# Patient Record
Sex: Female | Born: 1980 | Race: White | Hispanic: No | Marital: Single | Smoking: Never smoker
Health system: Southern US, Community
[De-identification: ages and names within clinical notes are randomized; demographics above are authoritative.]

## PROBLEM LIST (undated history)

## (undated) HISTORY — PX: TUBAL LIGATION: SHX77

## (undated) HISTORY — PX: CHOLECYSTECTOMY: SHX55

---

## 2004-03-20 ENCOUNTER — Emergency Department: Payer: Self-pay | Admitting: Emergency Medicine

## 2004-03-23 ENCOUNTER — Emergency Department: Payer: Self-pay | Admitting: Emergency Medicine

## 2004-11-05 ENCOUNTER — Inpatient Hospital Stay: Payer: Self-pay

## 2005-08-04 ENCOUNTER — Emergency Department: Payer: Self-pay | Admitting: Internal Medicine

## 2005-08-06 ENCOUNTER — Ambulatory Visit: Payer: Self-pay | Admitting: Internal Medicine

## 2005-08-06 ENCOUNTER — Ambulatory Visit: Payer: Self-pay | Admitting: General Surgery

## 2005-08-08 ENCOUNTER — Inpatient Hospital Stay: Payer: Self-pay | Admitting: General Surgery

## 2007-04-07 ENCOUNTER — Emergency Department: Payer: Self-pay | Admitting: Emergency Medicine

## 2007-08-12 IMAGING — CR DG ABDOMEN 2V
1 series · 3 of 3 positions shown · non-contrast
Comparison: none

REASON FOR EXAM: Abd Pain; post lap GB
COMMENTS:

[Series 1: view not recorded · 0.17mm/px · 3 of 3 slices shown]
[im 1/3]
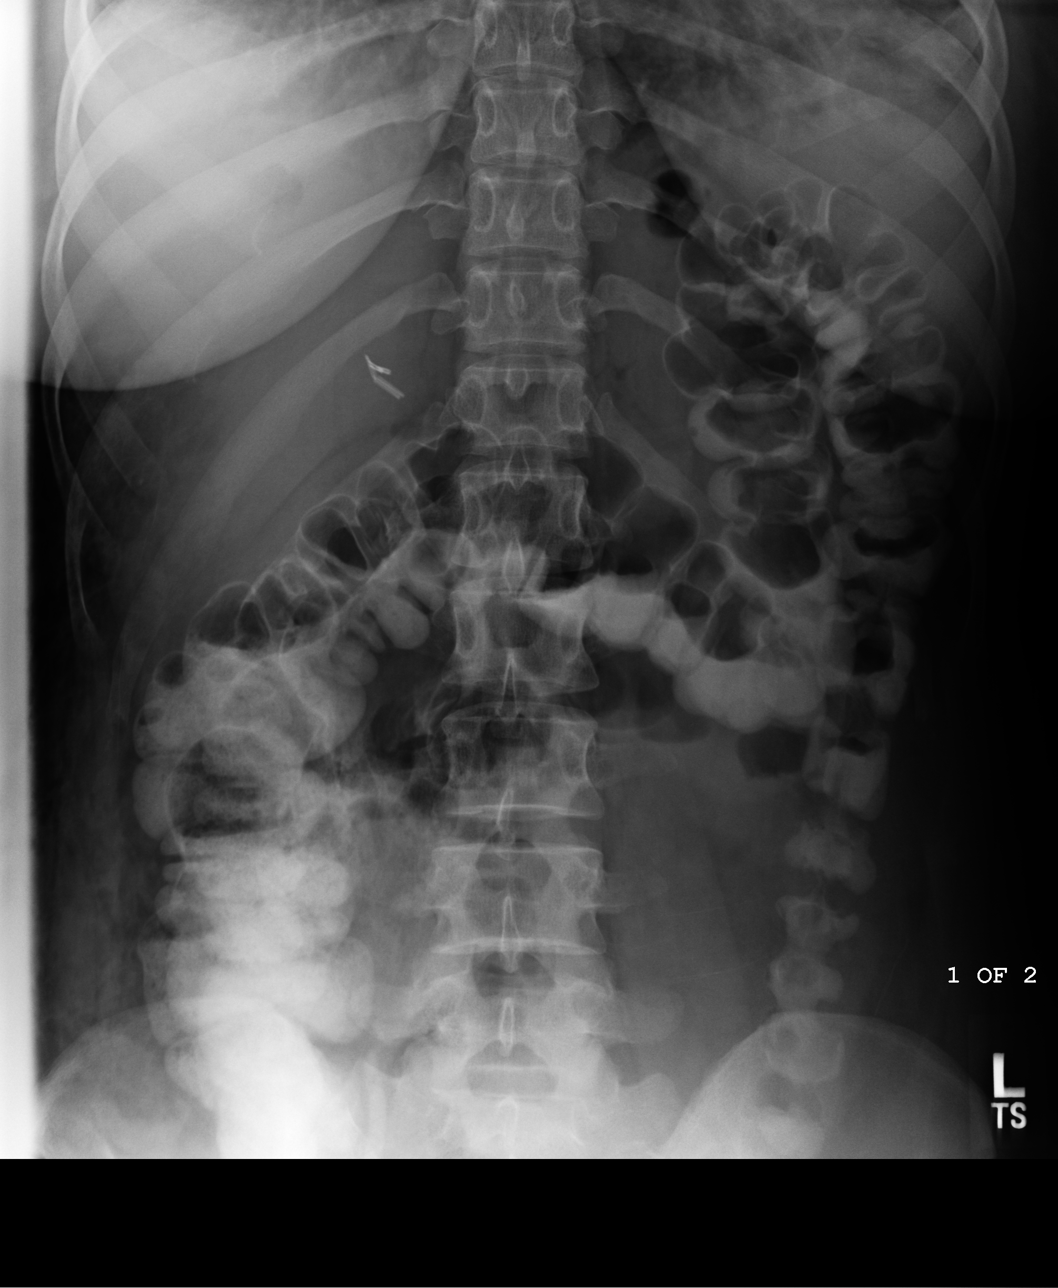
[im 2/3]
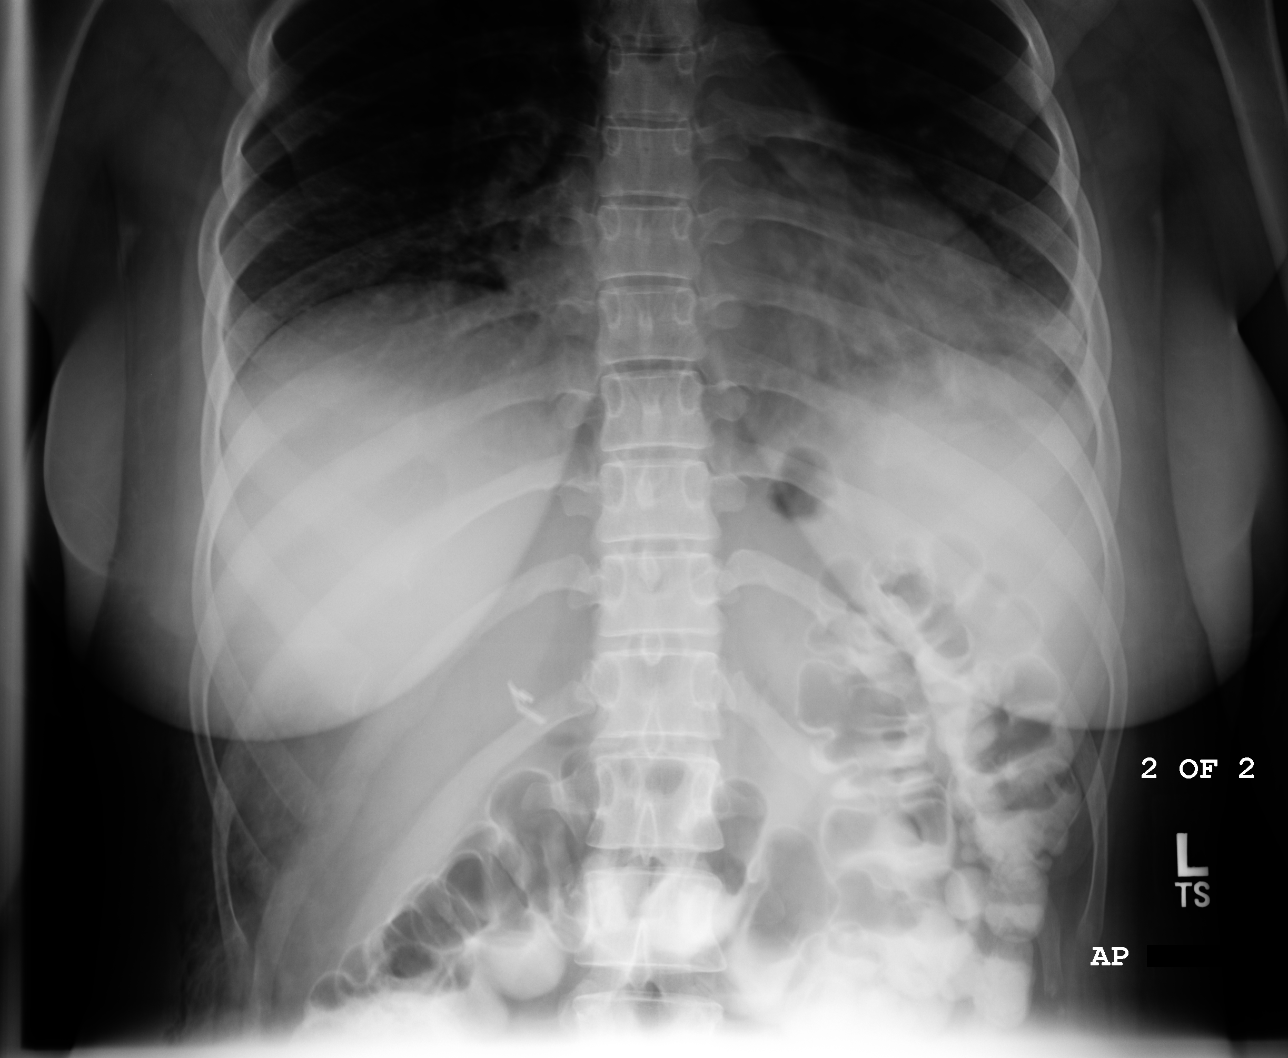
[im 3/3]
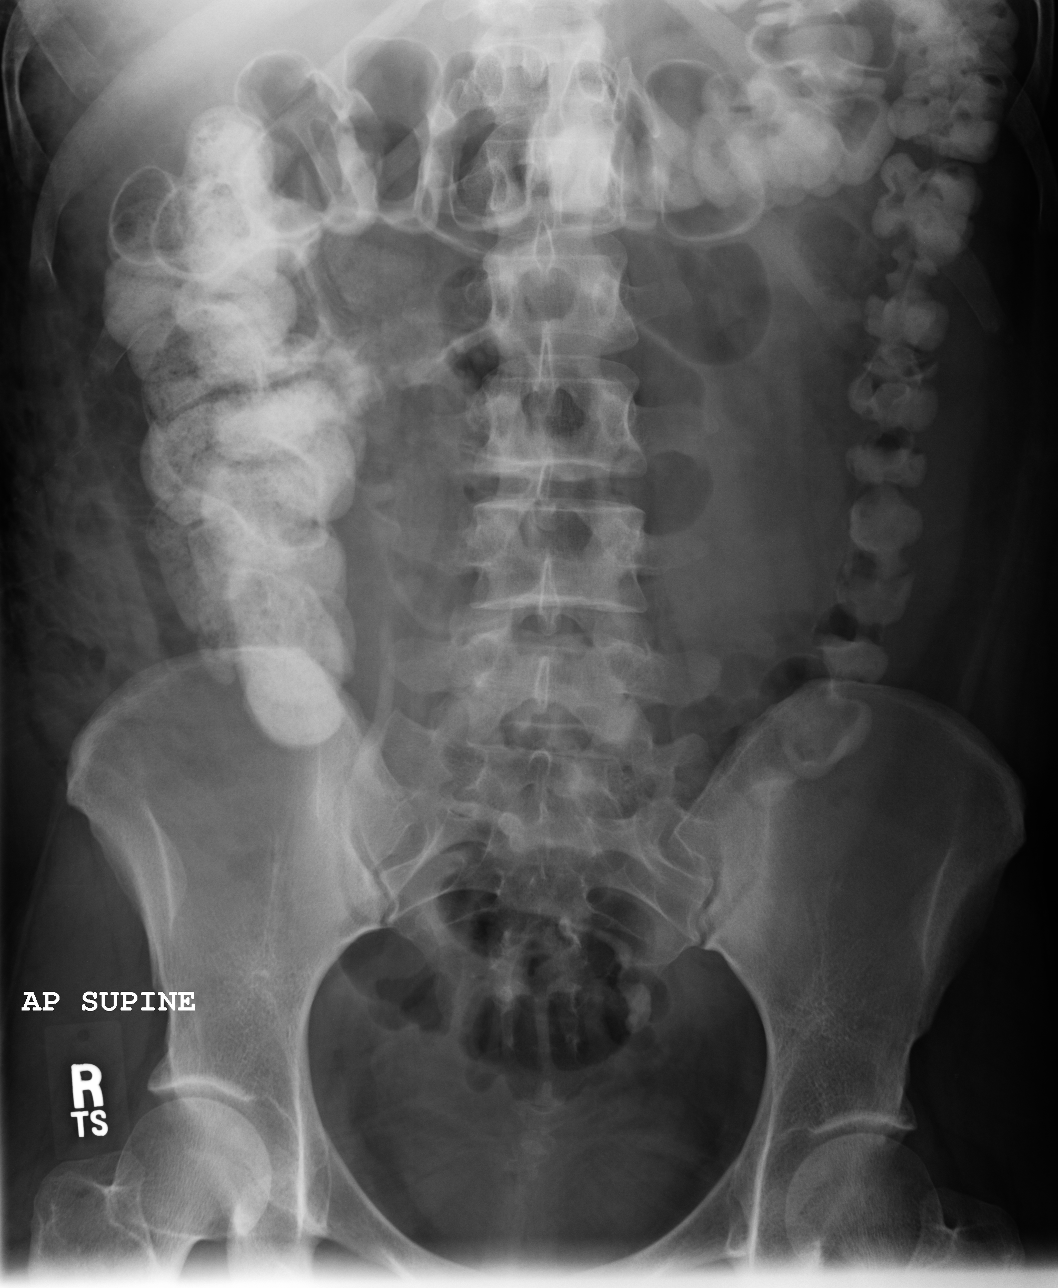

[3 of 3 positions shown; findings below may reference images not displayed]

PROCEDURE:     DXR - DXR ABDOMEN 2 V FLAT AND ERECT  - August 09, 2005  [DATE]

RESULT:     Flat and erect views of the abdomen show contrast material in
the colon compatible with residual contrast from prior CT examination.  No
evidence for bowel obstruction is seen. No abnormal intraabdominal
calcifications are identified, but of course the bowel contrast could
obscure calcifications.  Postoperative metallic clips are seen in the region
of the gallbladder bed.

The erect view shows no subdiaphragmatic free air.  There is noted increased
density in the LEFT lung base compatible with pneumonia or atelectasis.
IMPRESSION: 1)Views of the abdomen show no specific abnormalities.

2)In the erect view the LEFT lung base is visualized where there is noted
increased density compatible with pneumonia or atelectasis.

## 2010-03-13 ENCOUNTER — Emergency Department: Payer: Self-pay | Admitting: Emergency Medicine

## 2011-07-15 ENCOUNTER — Emergency Department: Payer: Self-pay | Admitting: Unknown Physician Specialty

## 2011-07-15 LAB — COMPREHENSIVE METABOLIC PANEL
Anion Gap: 8 (ref 7–16)
BUN: 10 mg/dL (ref 7–18)
Bilirubin,Total: 0.3 mg/dL (ref 0.2–1.0)
Calcium, Total: 8.9 mg/dL (ref 8.5–10.1)
Chloride: 107 mmol/L (ref 98–107)
Co2: 27 mmol/L (ref 21–32)
Creatinine: 0.61 mg/dL (ref 0.60–1.30)
EGFR (African American): >60
Potassium: 4.4 mmol/L (ref 3.5–5.1)
SGOT(AST): 15 U/L (ref 15–37)
Sodium: 142 mmol/L (ref 136–145)

## 2011-07-15 LAB — CBC
HCT: 42.2 % (ref 35.0–47.0)
MCV: 94 fL (ref 80–100)
Platelet: 185 10*3/uL (ref 150–440)
RBC: 4.48 10*6/uL (ref 3.80–5.20)
RDW: 13.7 % (ref 11.5–14.5)

## 2012-05-31 ENCOUNTER — Emergency Department: Payer: Self-pay | Admitting: Internal Medicine

## 2013-02-01 ENCOUNTER — Emergency Department: Payer: Self-pay | Admitting: Emergency Medicine

## 2013-02-01 LAB — CBC WITH DIFFERENTIAL/PLATELET
BASOS ABS: 0.1 10*3/uL (ref 0.0–0.1)
Basophil %: 0.6 %
Eosinophil #: 0.5 10*3/uL (ref 0.0–0.7)
Eosinophil %: 4.3 %
HCT: 39.2 % (ref 35.0–47.0)
HGB: 13.3 g/dL (ref 12.0–16.0)
LYMPHS ABS: 2.6 10*3/uL (ref 1.0–3.6)
LYMPHS PCT: 20.5 %
MCH: 31.2 pg (ref 26.0–34.0)
MCHC: 33.8 g/dL (ref 32.0–36.0)
MCV: 92 fL (ref 80–100)
MONO ABS: 0.7 x10 3/mm (ref 0.2–0.9)
Monocyte %: 5.3 %
NEUTROS ABS: 8.8 10*3/uL — AB (ref 1.4–6.5)
Neutrophil %: 69.3 %
PLATELETS: 191 10*3/uL (ref 150–440)
RBC: 4.25 10*6/uL (ref 3.80–5.20)
RDW: 13.6 % (ref 11.5–14.5)
WBC: 12.7 10*3/uL — ABNORMAL HIGH (ref 3.6–11.0)

## 2013-02-01 LAB — BASIC METABOLIC PANEL
Anion Gap: 4 — ABNORMAL LOW (ref 7–16)
BUN: 9 mg/dL (ref 7–18)
CALCIUM: 8.9 mg/dL (ref 8.5–10.1)
CO2: 25 mmol/L (ref 21–32)
Chloride: 106 mmol/L (ref 98–107)
Creatinine: 0.67 mg/dL (ref 0.60–1.30)
EGFR (African American): >60
EGFR (Non-African Amer.): >60
Glucose: 98 mg/dL (ref 65–99)
Osmolality: 269 (ref 275–301)
POTASSIUM: 3.9 mmol/L (ref 3.5–5.1)
Sodium: 135 mmol/L — ABNORMAL LOW (ref 136–145)

## 2013-05-12 ENCOUNTER — Emergency Department: Payer: Self-pay | Admitting: Emergency Medicine

## 2013-07-17 IMAGING — CT CT HEAD WITHOUT CONTRAST
2 series · 16 of 30 positions shown, 20 images · non-contrast
Comparison: none

REASON FOR EXAM: ha
COMMENTS:

[Series 2: without · axial · non-contrast · 0.46mm/px · z∈[-20,+110]mm · 13 of 32 slices shown, 17 images]
[im 3/32  brain]
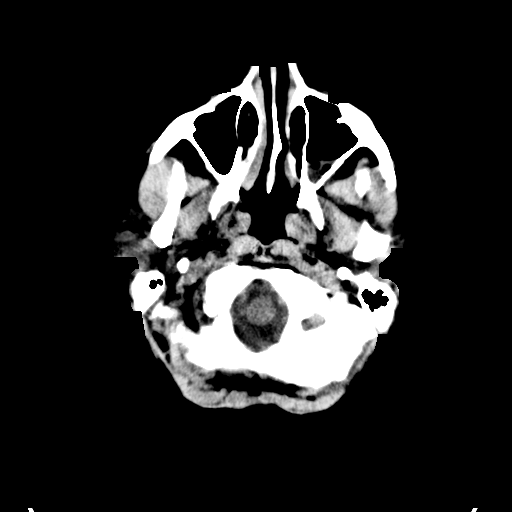
[im 3/32  bone]
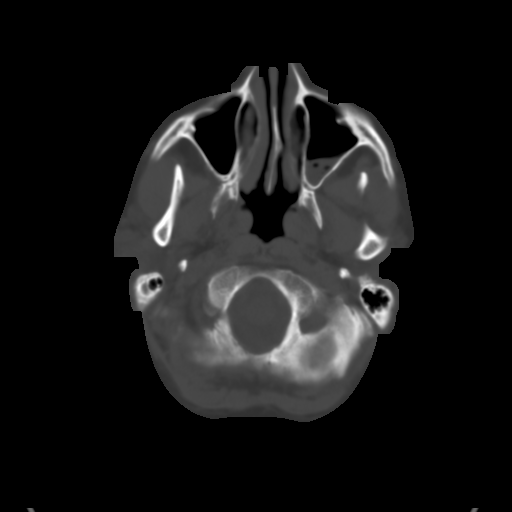
[im 5/32  brain]
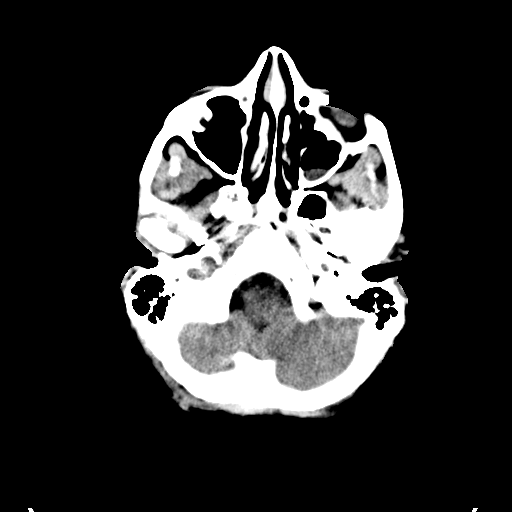
[im 7/32  brain]
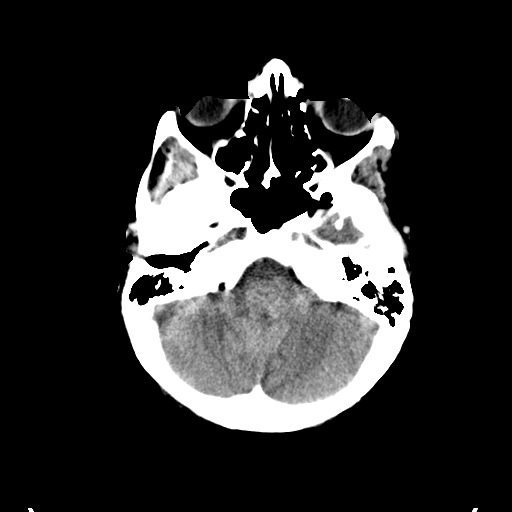
[im 9/32  brain]
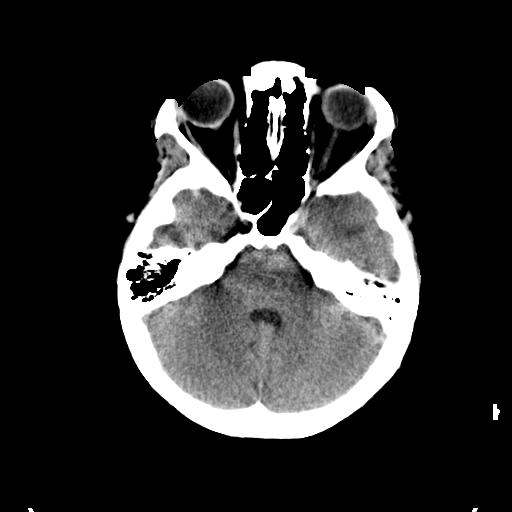
[im 12/32  brain]
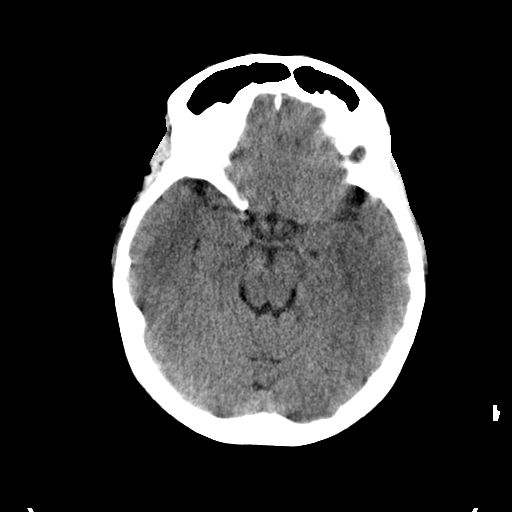
[im 12/32  bone]
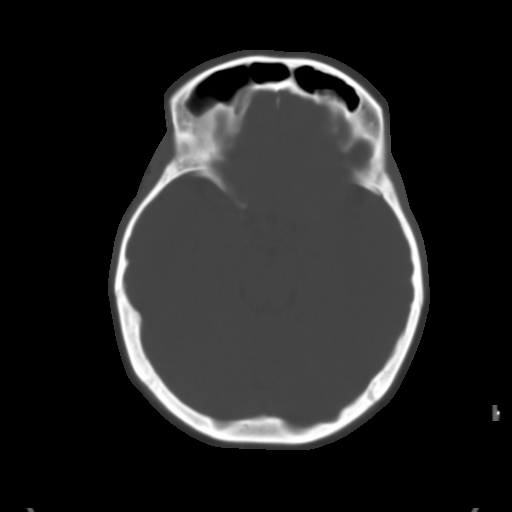
[im 14/32  brain]
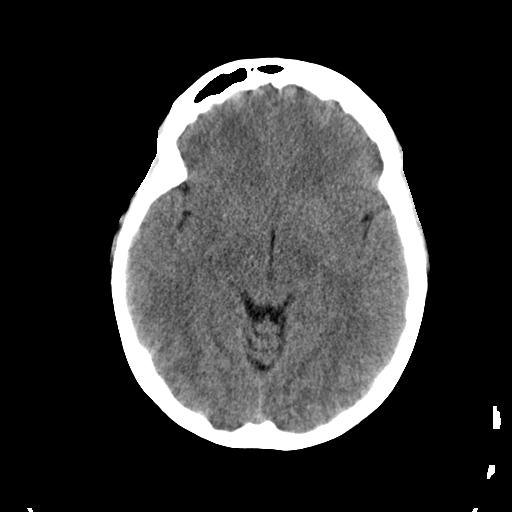
[im 16/32  brain]
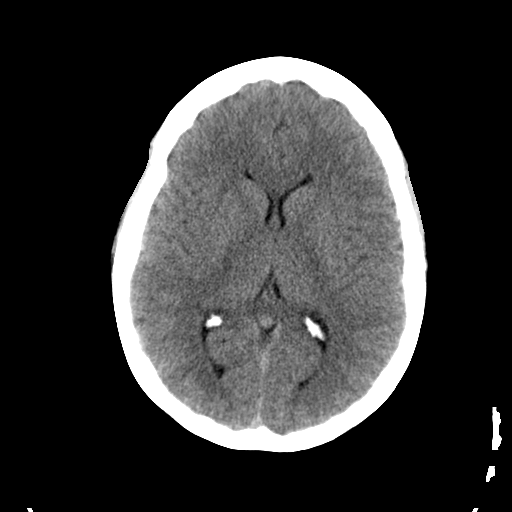
[im 18/32  brain]
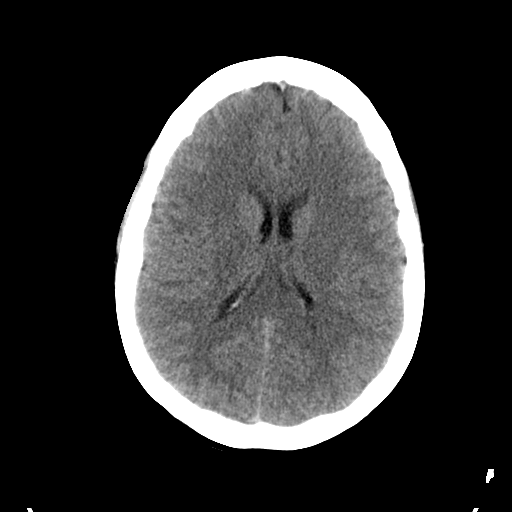
[im 20/32  brain]
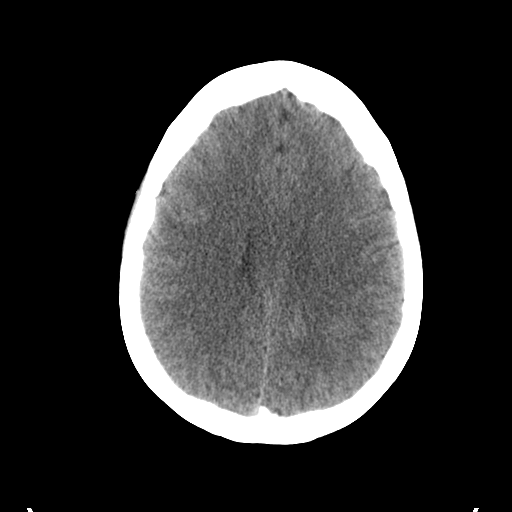
[im 20/32  bone]
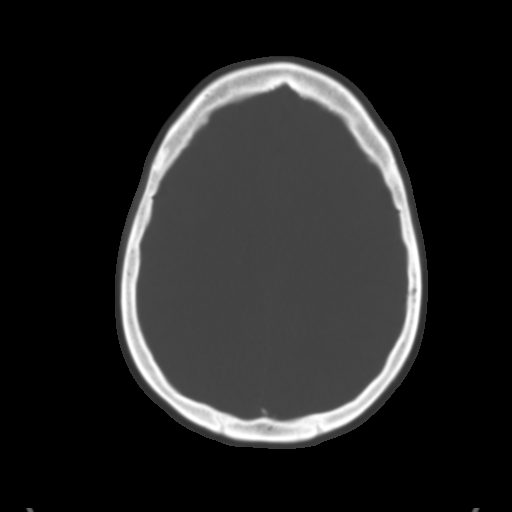
[im 23/32  brain]
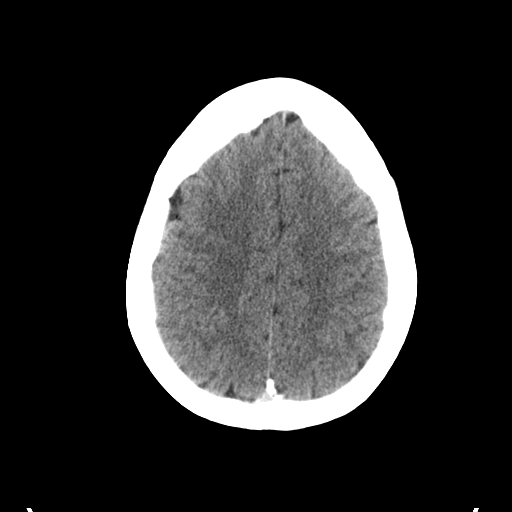
[im 25/32  brain]
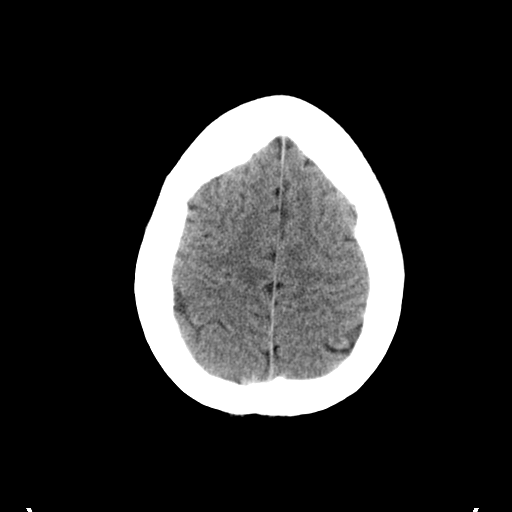
[im 27/32  brain]
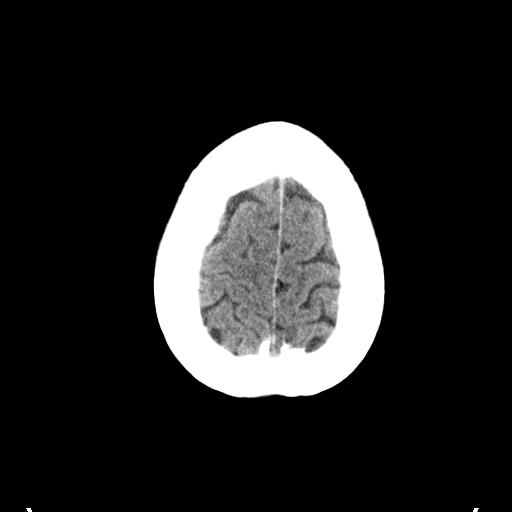
[im 29/32  brain]
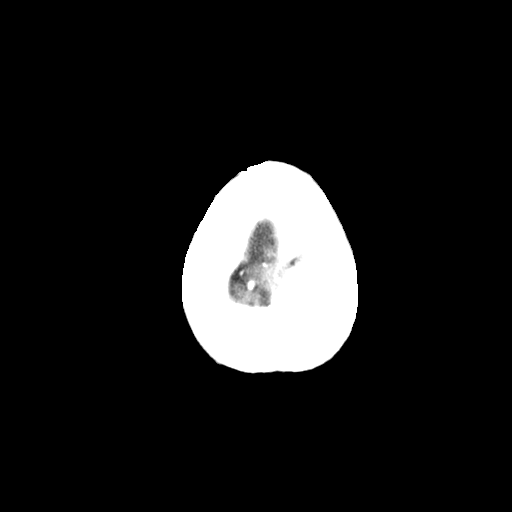
[im 29/32  bone]
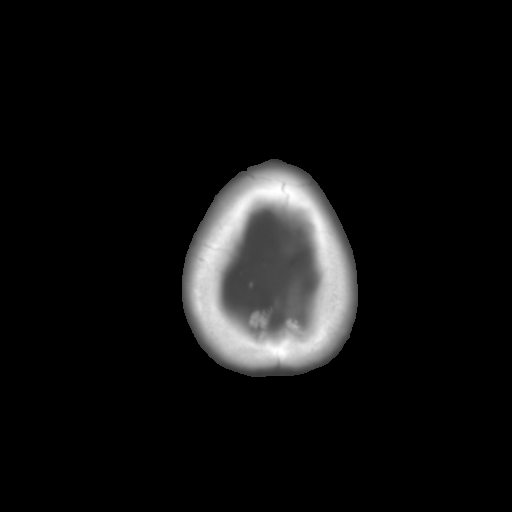

[Series 3: bone · axial · 0.46mm/px · z∈[-20,+24]mm · 3 of 32 slices shown]
[im 3/32  bone]
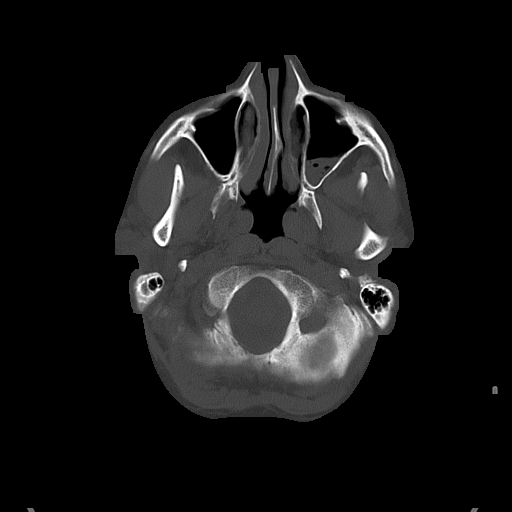
[im 7/32  bone]
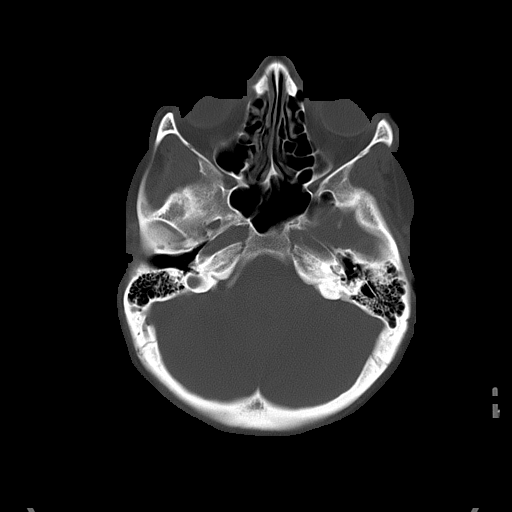
[im 12/32  bone]
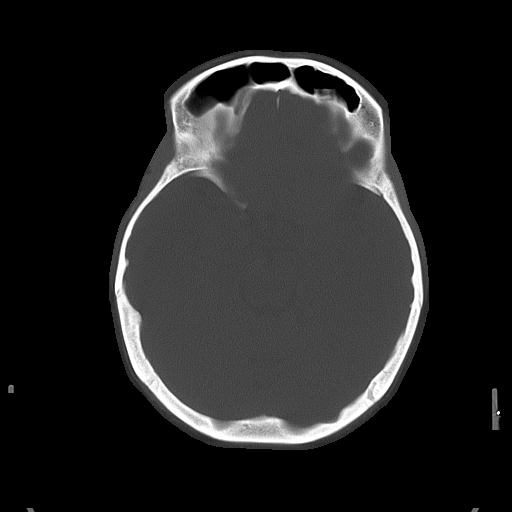

[16 of 30 positions shown; findings below may reference images not displayed]

PROCEDURE:     CT  - CT HEAD WITHOUT CONTRAST  - July 15, 2011  [DATE]

RESULT:     Axial noncontrast CT scanning was performed through the brain
with reconstructions at 5 mm intervals and slice thicknesses.

The ventricles are normal in size and position. There is no intracranial
hemorrhage nor intracranial mass effect. The cerebellum and brainstem are
normal in density. There is no evidence of an evolving ischemic event.

There is an air-fluid level in the left maxillary sinus. I see no evidence
of an acute skull fracture.
IMPRESSION: 1. I see no acute abnormality of the brain.
2. An air-fluid level is present in the left maxillary sinus consistent with
acute sinusitis.

A preliminary report was sent to the [HOSPITAL] the conclusion
of the study.

## 2015-02-05 IMAGING — CR RIGHT ANKLE - COMPLETE 3+ VIEW
1 series · 3 of 3 positions shown · non-contrast
Comparison: None.

CLINICAL DATA: Redness and swelling for 1 the. Evaluate for
subcutaneous emphysema.

EXAM:
RIGHT ANKLE - COMPLETE 3+ VIEW

[Series 1: ap · 0.17mm/px · 3 of 3 slices shown]
[im 1/3]
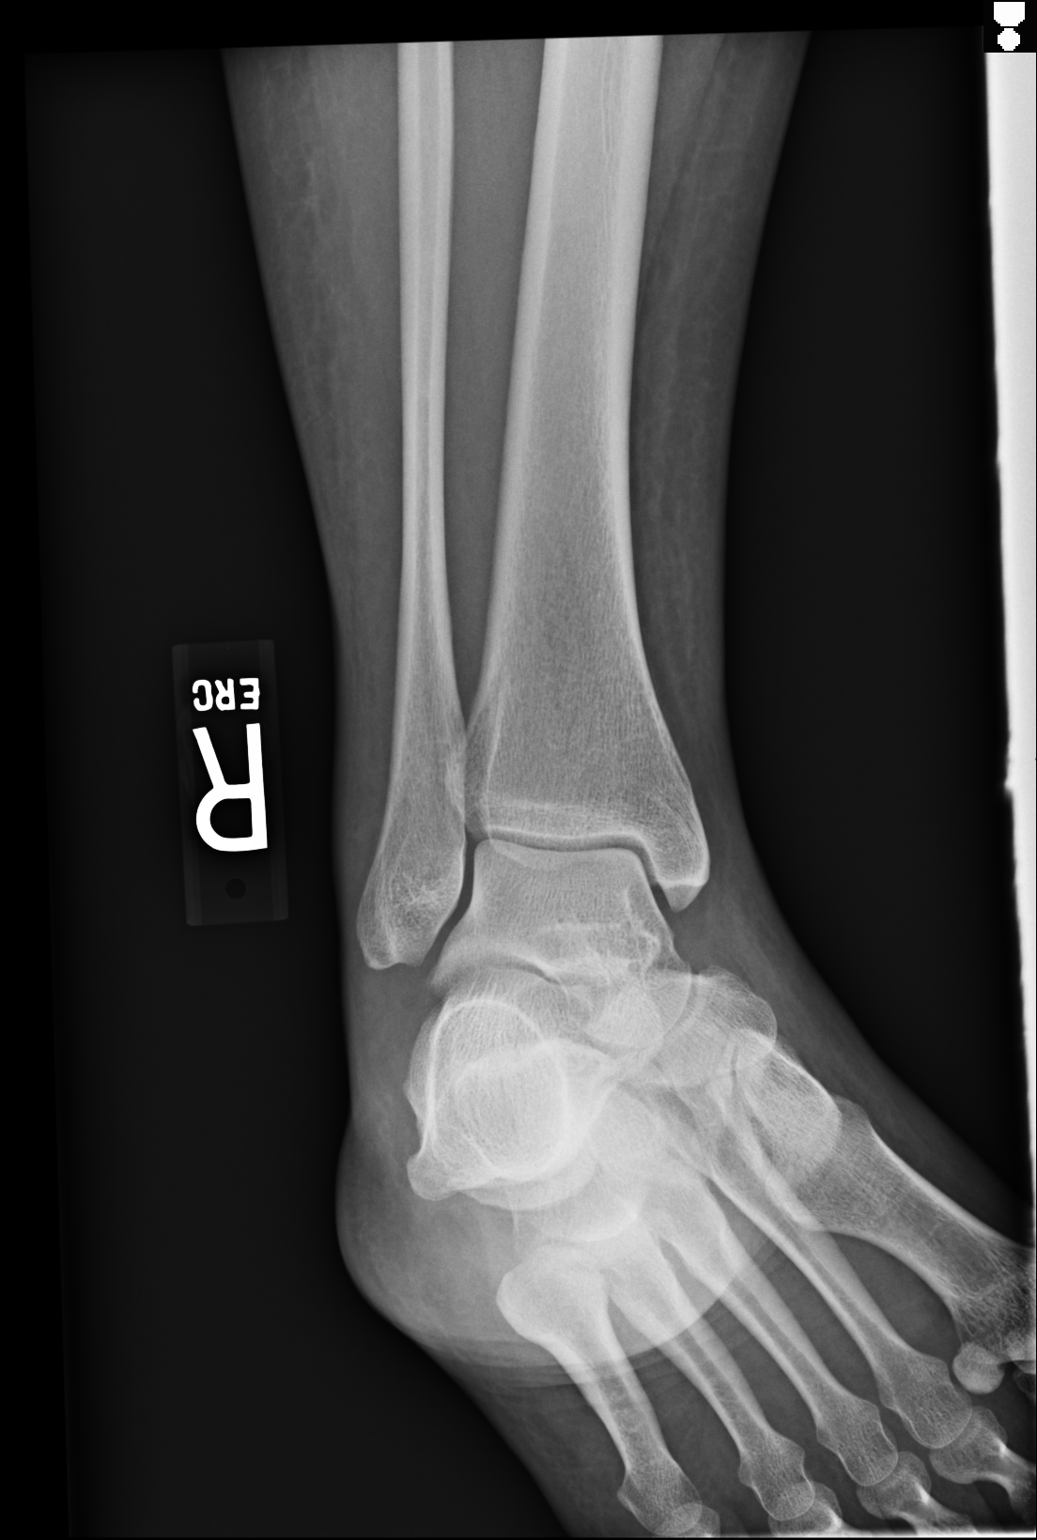
[im 2/3]
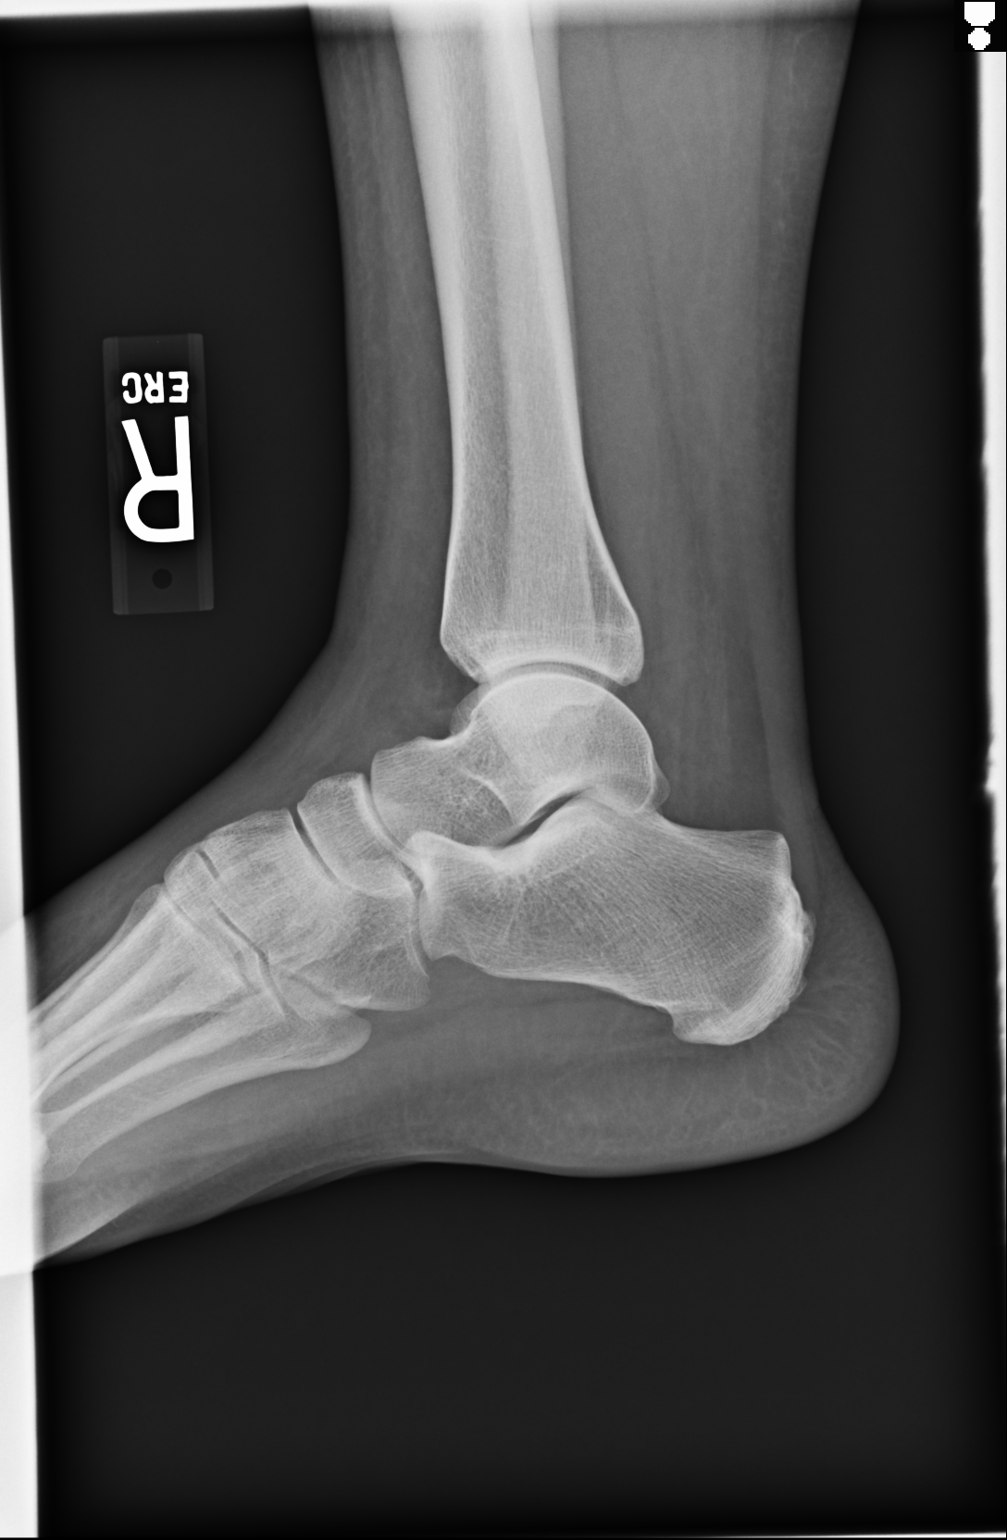
[im 3/3]
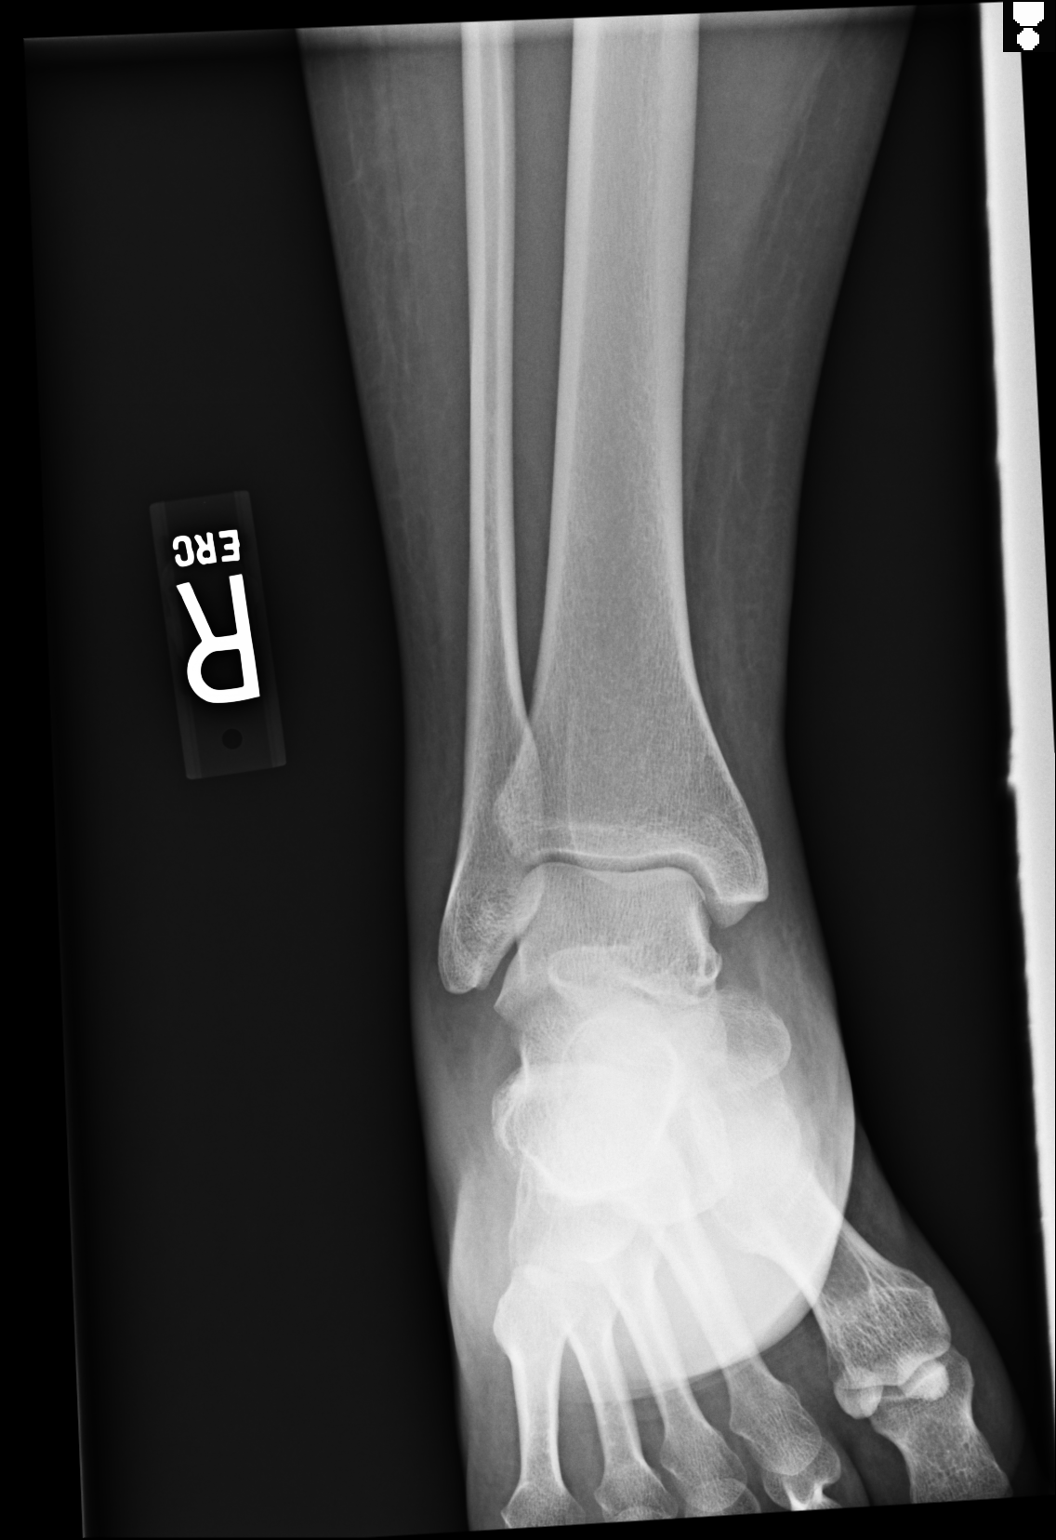

[3 of 3 positions shown; findings below may reference images not displayed]

FINDINGS: Normal mineralization and alignment. No evidence of acute fracture
or or focal bony abnormality.

There is diffuse edema subcutaneous stranding of the visualized
portion of the lower extremity. Negative for subcutaneous
gas/emphysema. No radiopaque foreign body.
IMPRESSION: Diffuse soft tissue swelling.  No acute bony abnormality.

## 2015-09-07 ENCOUNTER — Emergency Department
Admission: EM | Admit: 2015-09-07 | Discharge: 2015-09-07 | Disposition: A | Discharge status: Home / Self Care | Payer: Self-pay | Attending: Emergency Medicine | Admitting: Emergency Medicine

## 2015-09-07 ENCOUNTER — Encounter: Payer: Self-pay | Admitting: Emergency Medicine

## 2015-09-07 DIAGNOSIS — G43909 Migraine, unspecified, not intractable, without status migrainosus: Secondary | ICD-10-CM | POA: Insufficient documentation

## 2015-09-07 MED ORDER — METOCLOPRAMIDE HCL 5 MG/ML IJ SOLN
10.0000 mg | Freq: Once | INTRAMUSCULAR | Status: AC
  Administered 2015-09-07: 10 mg via INTRAVENOUS
  Filled 2015-09-07 (×2): qty 2

## 2015-09-07 MED ORDER — DIPHENHYDRAMINE HCL 50 MG/ML IJ SOLN
50.0000 mg | Freq: Once | INTRAMUSCULAR | Status: AC
  Administered 2015-09-07: 50 mg via INTRAVENOUS

## 2015-09-07 MED ORDER — DIPHENHYDRAMINE HCL 50 MG/ML IJ SOLN
INTRAMUSCULAR | Status: AC
  Administered 2015-09-07: 50 mg via INTRAVENOUS
  Filled 2015-09-07: qty 1

## 2015-09-07 MED ORDER — KETOROLAC TROMETHAMINE 30 MG/ML IJ SOLN
30.0000 mg | Freq: Once | INTRAMUSCULAR | Status: AC
  Administered 2015-09-07: 30 mg via INTRAVENOUS

## 2015-09-07 MED ORDER — KETOROLAC TROMETHAMINE 30 MG/ML IJ SOLN
INTRAMUSCULAR | Status: AC
  Administered 2015-09-07: 30 mg via INTRAVENOUS
  Filled 2015-09-07: qty 1

## 2015-09-07 MED ORDER — SODIUM CHLORIDE 0.9 % IV BOLUS (SEPSIS)
1000.0000 mL | Freq: Once | INTRAVENOUS | Status: AC
  Administered 2015-09-07: 1000 mL via INTRAVENOUS

## 2015-09-07 MED ORDER — BUTALBITAL-APAP-CAFFEINE 50-325-40 MG PO TABS: 1.0000 | ORAL_TABLET | Freq: Four times a day (QID) | ORAL | 0 refills | Status: AC | PRN

## 2015-09-07 NOTE — ED Provider Notes (Signed)
[facility] Emergency Department Provider Note  Time seen: 3:30 PM  I have reviewed the triage vital signs and the nursing notes.   HISTORY  Chief Complaint Migraine    HPI Paige Clark is a 35 y.o. female with a past medical history migraines who presents the emergency department the migraine headache. According to the patientshe developed a migraine headache earlier this morning. States it feels typical of her migraine headaches but she has not happened this in approximately one year. States nausea, photo and phonophobia which are typical of her migraine headaches. Denies any focal weakness or numbness. Denies any recent trauma. Denies fever pain or neck stiffness.  No past medical history on file.  There are no active problems to display for this patient.   No past surgical history on file.  Prior to Admission medications   Not on File    No Known Allergies  No family history on file.  Social History Social History  Substance Use Topics  . Smoking status: Not on file  . Smokeless tobacco: Not on file  . Alcohol use Not on file    Review of Systems Constitutional: Negative for fever. Eyes: Photophobia. Cardiovascular: Negative for chest pain. Respiratory: Negative for shortness of breath. Gastrointestinal: Negative for abdominal pain. Positive for nausea. Genitourinary: Negative for dysuria. Musculoskeletal: Negative for back pain. Neurological: Negative for headache 10-point ROS otherwise negative.  ____________________________________________   PHYSICAL EXAM:  VITAL SIGNS: ED Triage Vitals  Enc Vitals Group     BP 09/07/15 1509 (!) 141/85     Pulse Rate 09/07/15 1509 93     Resp 09/07/15 1509 18     Temp 09/07/15 1509 97.9 F (36.6 C)     Temp Source 09/07/15 1509 Oral     SpO2 09/07/15 1509 100 %     Weight 09/07/15 1509 165 lb (74.8 kg)     Height 09/07/15 1509 5\' 6"  (1.676 m)     Head Circumference --      Peak Flow  --      Pain Score 09/07/15 1510 8     Pain Loc --      Pain Edu? --      Excl. in GC? --     Constitutional: Alert and oriented. Keeps her eyes closed due to discomfort. Eyes: Mild photophobia. ENT   Head: Normocephalic and atraumatic.   Mouth/Throat: Mucous membranes are moist. Cardiovascular: Normal rate, regular rhythm. No murmur Respiratory: Normal respiratory effort without tachypnea nor retractions. Breath sounds are clear  Gastrointestinal: Soft and nontender. No distention.  Musculoskeletal: Nontender with normal range of motion in all extremities. No lower extremity tenderness or edema. Neurologic:  Normal speech and language. No gross focal neurologic deficits are appreciated. Equal grip strength. No pronator drift. Skin:  Skin is warm, dry and intact.  Psychiatric: Mood and affect are normal. Speech and behavior are normal.   ____________________________________________   INITIAL IMPRESSION / ASSESSMENT AND PLAN / ED COURSE  Pertinent labs & imaging results that were available during my care of the patient were reviewed by me and considered in my medical decision making (see chart for details).  The patient presents emergency department with symptoms of a migraine headache. Typical of her migraine headaches. We will treat the patient's discomfort and closely monitor in the emergency department. Patient has a normal neurologic exam.  Patient states her headache is much better. We'll discharge with Fioricet.  ____________________________________________   FINAL CLINICAL IMPRESSION(S) / ED DIAGNOSES  Migraine headache    Minna Antis, MD 09/07/15 [phone]

## 2015-09-07 NOTE — ED Triage Notes (Signed)
States migrane that began at 0600, states hx of migranes, pt has been vomiting, arrives wearing sunglassess

## 2017-09-13 ENCOUNTER — Ambulatory Visit
Admission: EM | Admit: 2017-09-13 | Discharge: 2017-09-13 | Disposition: A | Discharge status: Home / Self Care | Payer: Self-pay | Attending: Family Medicine | Admitting: Family Medicine

## 2017-09-13 ENCOUNTER — Encounter: Payer: Self-pay | Admitting: Emergency Medicine

## 2017-09-13 ENCOUNTER — Other Ambulatory Visit: Payer: Self-pay

## 2017-09-13 DIAGNOSIS — R319 Hematuria, unspecified: Secondary | ICD-10-CM

## 2017-09-13 DIAGNOSIS — N390 Urinary tract infection, site not specified: Secondary | ICD-10-CM

## 2017-09-13 LAB — URINALYSIS, COMPLETE (UACMP) WITH MICROSCOPIC
BILIRUBIN URINE: NEGATIVE
GLUCOSE, UA: NEGATIVE mg/dL
KETONES UR: NEGATIVE mg/dL
LEUKOCYTES UA: NEGATIVE
Nitrite: NEGATIVE
PH: 5.5 (ref 5.0–8.0)
Protein, ur: NEGATIVE mg/dL
SPECIFIC GRAVITY, URINE: 1.025 (ref 1.005–1.030)

## 2017-09-13 MED ORDER — CEPHALEXIN 500 MG PO CAPS: 500.0000 mg | ORAL_CAPSULE | Freq: Two times a day (BID) | ORAL | 0 refills | Status: AC

## 2017-09-13 NOTE — ED Triage Notes (Signed)
Patient c/o urinary frequency, dysuria and low back pain that started 2 weeks ago.

## 2017-09-13 NOTE — ED Provider Notes (Signed)
[facility]    CSN: [id number] Arrival date & time: 09/13/17  1517     History   Chief Complaint Chief Complaint  Patient presents with  . Dysuria    HPI Paige Clark is a 37 y.o. female.   The history is provided by the patient.  Dysuria  Pain quality:  Burning Pain severity:  Mild Onset quality:  Sudden Duration:  2 weeks Timing:  Constant Progression:  Unchanged Chronicity:  New Recent urinary tract infections: no   Relieved by:  None tried Ineffective treatments:  None tried Associated symptoms: no abdominal pain, no fever, no flank pain, no genital lesions, no nausea and no vaginal discharge   Associated symptoms comment:  Low back pain Risk factors: no hx of pyelonephritis, no hx of urolithiasis, no kidney transplant, not pregnant, no recurrent urinary tract infections, no renal disease, no single kidney and no urinary catheter     History reviewed. No pertinent past medical history.  There are no active problems to display for this patient.   Past Surgical History:  Procedure Laterality Date  . CHOLECYSTECTOMY    . TUBAL LIGATION      OB History   None      Home Medications    Prior to Admission medications   Medication Sig Start Date End Date Taking? Authorizing Provider  cetirizine (ZYRTEC) 10 MG tablet Take 10 mg by mouth daily.   Yes [provider]  cephALEXin (KEFLEX) 500 MG capsule Take 1 capsule (500 mg total) by mouth 2 (two) times daily. 09/13/17   Payton Mccallumonty, Destinie Thornsberry, MD    Family History Family History  Problem Relation Age of Onset  . Diabetes Mother   . Healthy Father     Social History Social History   Tobacco Use  . Smoking status: Never Smoker  . Smokeless tobacco: Never Used  Substance Use Topics  . Alcohol use: Never    Frequency: Never  . Drug use: Never     Allergies   Patient has no known allergies.   Review of Systems Review of Systems  Constitutional: Negative for fever.    Gastrointestinal: Negative for abdominal pain and nausea.  Genitourinary: Positive for dysuria. Negative for flank pain and vaginal discharge.     Physical Exam Triage Vital Signs ED Triage Vitals  Enc Vitals Group     BP 09/13/17 1540 108/62     Pulse Rate 09/13/17 1540 92     Resp 09/13/17 1540 16     Temp 09/13/17 1540 98 F (36.7 C)     Temp Source 09/13/17 1540 Oral     SpO2 09/13/17 1540 99 %     Weight 09/13/17 1536 155 lb (70.3 kg)     Height 09/13/17 1536 5\' 6"  (1.676 m)     Head Circumference --      Peak Flow --      Pain Score 09/13/17 1536 3     Pain Loc --      Pain Edu? --      Excl. in GC? --    No data found.  Updated Vital Signs BP 108/62 (BP Location: Left Arm)   Pulse 92   Temp 98 F (36.7 C) (Oral)   Resp 16   Ht 5\' 6"  (1.676 m)   Wt 70.3 kg   LMP 09/09/2017 (Approximate)   SpO2 99%   BMI 25.02 kg/m   Visual Acuity Right Eye Distance:   Left Eye Distance:   Bilateral  Distance:    Right Eye Near:   Left Eye Near:    Bilateral Near:     Physical Exam  Constitutional: She appears well-developed and well-nourished. No distress.  Abdominal: Soft. She exhibits no distension. There is no tenderness. There is no guarding.  Skin: She is not diaphoretic.  Nursing note and vitals reviewed.    UC Treatments / Results  Labs (all labs ordered are listed, but only abnormal results are displayed) Labs Reviewed  URINALYSIS, COMPLETE (UACMP) WITH MICROSCOPIC - Abnormal; Notable for the following components:      Result Value   Hgb urine dipstick MODERATE (*)    Bacteria, UA RARE (*)    All other components within normal limits  URINE CULTURE    EKG None  Radiology No results found.  Procedures Procedures (including critical care time)  Medications Ordered in UC Medications - No data to display  Initial Impression / Assessment and Plan / UC Course  I have reviewed the triage vital signs and the nursing notes.  Pertinent labs &  imaging results that were available during my care of the patient were reviewed by me and considered in my medical decision making (see chart for details).      Final Clinical Impressions(s) / UC Diagnoses   Final diagnoses:  Urinary tract infection with hematuria, site unspecified   Discharge Instructions   None    ED Prescriptions    Medication Sig Dispense Auth. Provider   cephALEXin (KEFLEX) 500 MG capsule Take 1 capsule (500 mg total) by mouth 2 (two) times daily. 10 capsule Payton Mccallumonty, Kawon Willcutt, MD      1. Lab results and diagnosis reviewed with patient 2. rx as per orders above; reviewed possible side effects, interactions, risks and benefits  3. Recommend supportive treatment with increased fluids, otc analgesics prn 4. Follow-up prn if symptoms worsen or don't improve Controlled Substance Prescriptions [place] Controlled Substance Registry consulted? Not Applicable   Payton Mccallumonty, Mikeisha Lemonds, MD 09/13/17 [phone]

## 2017-09-15 LAB — URINE CULTURE: Special Requests: NORMAL

## 2019-01-27 ENCOUNTER — Ambulatory Visit: Payer: Self-pay | Attending: Internal Medicine

## 2019-01-27 DIAGNOSIS — Z20822 Contact with and (suspected) exposure to covid-19: Secondary | ICD-10-CM

## 2019-01-27 DIAGNOSIS — Z20828 Contact with and (suspected) exposure to other viral communicable diseases: Secondary | ICD-10-CM | POA: Insufficient documentation

## 2019-01-29 LAB — NOVEL CORONAVIRUS, NAA: SARS-CoV-2, NAA: NOT DETECTED

## 2020-02-18 ENCOUNTER — Other Ambulatory Visit: Payer: Self-pay

## 2020-02-18 DIAGNOSIS — Z20822 Contact with and (suspected) exposure to covid-19: Secondary | ICD-10-CM

## 2020-02-20 LAB — NOVEL CORONAVIRUS, NAA: SARS-CoV-2, NAA: DETECTED — AB

## 2020-02-20 LAB — SARS-COV-2, NAA 2 DAY TAT

## 2022-10-05 ENCOUNTER — Other Ambulatory Visit: Payer: Self-pay

## 2022-10-05 DIAGNOSIS — N6310 Unspecified lump in the right breast, unspecified quadrant: Secondary | ICD-10-CM

## 2022-11-05 ENCOUNTER — Ambulatory Visit
Admission: RE | Admit: 2022-11-05 | Discharge: 2022-11-05 | Disposition: A | Discharge status: Home / Self Care | Payer: Self-pay | Source: Ambulatory Visit | Attending: Obstetrics and Gynecology | Admitting: Obstetrics and Gynecology

## 2022-11-05 ENCOUNTER — Other Ambulatory Visit: Payer: Self-pay

## 2022-11-05 ENCOUNTER — Ambulatory Visit: Payer: Self-pay | Attending: Hematology and Oncology | Admitting: Hematology and Oncology

## 2022-11-05 VITALS — BP 99/65 | Wt 168.0 lb

## 2022-11-05 DIAGNOSIS — N6310 Unspecified lump in the right breast, unspecified quadrant: Secondary | ICD-10-CM

## 2022-11-05 DIAGNOSIS — Z01419 Encounter for gynecological examination (general) (routine) without abnormal findings: Secondary | ICD-10-CM

## 2022-11-05 NOTE — Patient Instructions (Addendum)
Taught Paige Clark about self breast awareness and gave educational materials to take home. Patient did not need a Pap smear today due to last Pap smear was greater than 5 years per patient. Let her know [facility] will cover Pap smears every 5 years unless has a history of abnormal Pap smears. Referred patient to the [facility] for screening mammogram. Appointment scheduled for 11/05/2022. Patient aware of appointment and will be there. Let patient know will follow up with her within the next couple weeks with results. Paige Clark verbalized understanding.  Pascal Lux, NP 12:37 PM

## 2022-11-05 NOTE — Progress Notes (Signed)
Paige Clark is a 42 y.o. No obstetric history on file. female who presents to [facility] clinic today with complaint of right breast lump.    Pap Smear: Pap smear completed today. Last Pap smear was greater than 5 years ago and was normal. Per patient has no history of an abnormal Pap smear. Last Pap smear result is not available in Epic.   Physical exam: Breasts Breasts symmetrical. No skin abnormalities bilateral breasts. No nipple retraction bilateral breasts. No nipple discharge bilateral breasts. No lymphadenopathy. No lumps palpated left breast. Right breast with lump noted at the nipple at 11 - 12 o'clock, mobile and approximately 2 cm.        Pelvic/Bimanual Ext Genitalia No lesions, no swelling and no discharge observed on external genitalia.        Vagina Vagina pink and normal texture. No lesions or discharge observed in vagina.        Cervix Cervix is present. Cervix pink and of normal texture. No discharge observed.    Uterus Uterus is present and palpable. Uterus in normal position and normal size.        Adnexae Bilateral ovaries present and palpable. No tenderness on palpation.         Rectovaginal No rectal exam completed today since patient had no rectal complaints. No skin abnormalities observed on exam.     Smoking History: Patient has is a former smoker who currently vapes and was referred to quit line.    Patient Navigation: Patient education provided. Access to services provided for patient through [facility] program. No interpreter provided. No transportation provided   Colorectal Cancer Screening: Per patient has never had colonoscopy completed No complaints today.    Breast and Cervical Cancer Risk Assessment: Patient does not have family history of breast cancer, known genetic mutations, or radiation treatment to the chest before age 8. Patient does not have history of cervical dysplasia, immunocompromised, or DES exposure in-utero.  Risk Scores as of  Encounter on 11/05/2022     [name]           5-year 0.64%   Lifetime 8.79%            Last calculated by Narda Rutherford, LPN on 16/01/958 at 12:55 PM        A: [facility] exam with pap smear Complaints of right breast mass as noted on exam. Will send for diagnostic imaging.   P: Referred patient to the [facility] for a diagnostic mammogram. Appointment scheduled 11/05/22.  Ilda Basset A, NP 11/05/2022 1:00 PM

## 2022-11-07 ENCOUNTER — Other Ambulatory Visit: Payer: Self-pay

## 2022-11-07 DIAGNOSIS — N6310 Unspecified lump in the right breast, unspecified quadrant: Secondary | ICD-10-CM

## 2022-11-08 ENCOUNTER — Other Ambulatory Visit: Payer: Self-pay | Admitting: Obstetrics and Gynecology

## 2022-11-08 DIAGNOSIS — R928 Other abnormal and inconclusive findings on diagnostic imaging of breast: Secondary | ICD-10-CM

## 2022-11-08 DIAGNOSIS — N6310 Unspecified lump in the right breast, unspecified quadrant: Secondary | ICD-10-CM

## 2022-11-08 LAB — CYTOLOGY - PAP
Adequacy: ABSENT
Comment: NEGATIVE
Diagnosis: NEGATIVE
High risk HPV: NEGATIVE

## 2023-05-07 ENCOUNTER — Ambulatory Visit
Admission: RE | Admit: 2023-05-07 | Discharge: 2023-05-07 | Disposition: A | Discharge status: Home / Self Care | Payer: Self-pay | Source: Ambulatory Visit | Attending: Obstetrics and Gynecology | Admitting: Obstetrics and Gynecology

## 2023-05-07 DIAGNOSIS — R928 Other abnormal and inconclusive findings on diagnostic imaging of breast: Secondary | ICD-10-CM

## 2023-08-27 ENCOUNTER — Other Ambulatory Visit: Payer: Self-pay

## 2023-08-27 DIAGNOSIS — N6310 Unspecified lump in the right breast, unspecified quadrant: Secondary | ICD-10-CM

## 2023-11-07 ENCOUNTER — Ambulatory Visit: Payer: Self-pay

## 2023-11-12 ENCOUNTER — Ambulatory Visit
Admission: RE | Admit: 2023-11-12 | Discharge: 2023-11-12 | Disposition: A | Discharge status: Home / Self Care | Payer: Self-pay | Source: Ambulatory Visit | Attending: Obstetrics and Gynecology | Admitting: Obstetrics and Gynecology

## 2023-11-12 DIAGNOSIS — N6310 Unspecified lump in the right breast, unspecified quadrant: Secondary | ICD-10-CM
